# Patient Record
Sex: Male | Born: 2000 | Race: Black or African American | Hispanic: No | Marital: Single | Smoking: Never smoker
Health system: Southern US, Community
[De-identification: ages and names within clinical notes are randomized; demographics above are authoritative.]

## PROBLEM LIST (undated history)

## (undated) DIAGNOSIS — J45909 Unspecified asthma, uncomplicated: Secondary | ICD-10-CM

---
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
## 2023-03-23 ENCOUNTER — Emergency Department
Admission: EM | Admit: 2023-03-23 | Discharge: 2023-03-23 | Disposition: A | Discharge status: Home / Self Care | Payer: BC Managed Care – PPO | Attending: Emergency Medicine | Admitting: Emergency Medicine

## 2023-03-23 DIAGNOSIS — K2970 Gastritis, unspecified, without bleeding: Secondary | ICD-10-CM | POA: Diagnosis not present

## 2023-03-23 DIAGNOSIS — R1010 Upper abdominal pain, unspecified: Secondary | ICD-10-CM | POA: Diagnosis present

## 2023-03-23 DIAGNOSIS — R0789 Other chest pain: Secondary | ICD-10-CM | POA: Diagnosis not present

## 2023-03-23 LAB — CBC
HCT: 45 % (ref 39.0–52.0)
Hemoglobin: 15.9 g/dL (ref 13.0–17.0)
MCH: 33.1 pg (ref 26.0–34.0)
MCHC: 35.3 g/dL (ref 30.0–36.0)
MCV: 93.6 fL (ref 80.0–100.0)
Platelets: 286 10*3/uL (ref 150–400)
RBC: 4.81 MIL/uL (ref 4.22–5.81)
RDW: 12.1 % (ref 11.5–15.5)
WBC: 8.3 10*3/uL (ref 4.0–10.5)
nRBC: 0 % (ref 0.0–0.2)

## 2023-03-23 LAB — TROPONIN I (HIGH SENSITIVITY)
Troponin I (High Sensitivity): <2 ng/L (ref <18)
Troponin I (High Sensitivity): 3 ng/L (ref <18)

## 2023-03-23 LAB — COMPREHENSIVE METABOLIC PANEL
ALT: 18 U/L (ref 0–44)
AST: 22 U/L (ref 15–41)
Albumin: 5.3 g/dL — ABNORMAL HIGH (ref 3.5–5.0)
Alkaline Phosphatase: 46 U/L (ref 38–126)
Anion gap: 14 (ref 5–15)
BUN: 16 mg/dL (ref 6–20)
CO2: 21 mmol/L — ABNORMAL LOW (ref 22–32)
Calcium: 9.8 mg/dL (ref 8.9–10.3)
Chloride: 102 mmol/L (ref 98–111)
Creatinine, Ser: 0.83 mg/dL (ref 0.61–1.24)
GFR, Estimated: >60 mL/min (ref >60)
Glucose, Bld: 90 mg/dL (ref 70–99)
Potassium: 3.5 mmol/L (ref 3.5–5.1)
Sodium: 137 mmol/L (ref 135–145)
Total Bilirubin: 1.5 mg/dL — ABNORMAL HIGH (ref <1.2)
Total Protein: 8.5 g/dL — ABNORMAL HIGH (ref 6.5–8.1)

## 2023-03-23 LAB — LIPASE, BLOOD: Lipase: 29 U/L (ref 11–51)

## 2023-03-23 MED ORDER — PANTOPRAZOLE SODIUM 40 MG PO TBEC: 40.0000 mg | DELAYED_RELEASE_TABLET | Freq: Every day | ORAL | 1 refills | Status: AC

## 2023-03-23 MED ORDER — ALUM & MAG HYDROXIDE-SIMETH 200-200-20 MG/5ML PO SUSP
30.0000 mL | Freq: Once | ORAL | Status: AC
  Administered 2023-03-23: 30 mL via ORAL
  Filled 2023-03-23: qty 30

## 2023-03-23 MED ORDER — LIDOCAINE VISCOUS HCL 2 % MT SOLN
15.0000 mL | Freq: Once | OROMUCOSAL | Status: AC
  Administered 2023-03-23: 15 mL via ORAL
  Filled 2023-03-23: qty 15

## 2023-03-23 NOTE — ED Notes (Signed)
Pt unable to void at this time. 

## 2023-03-23 NOTE — ED Provider Notes (Signed)
[facility] Provider Note    Event Date/Time   First MD Initiated Contact with Patient 03/23/23 2015     (approximate)   History   Abdominal Pain   HPI  PASON BRADNEY is a 22 y.o. male who presents to the emergency department today because of concerns for upper abdominal and lower chest pain.  The patient's symptoms started a couple of days ago.  He states that the pain does move around these areas.  It will at times be a sharp type of pain.  He denies any pain with deep breaths.  Eating does seem to make the pain slightly worse.  Did try Tums which helped.  He states he does eat a lot of spicy foods.     Physical Exam   Triage Vital Signs: ED Triage Vitals  Encounter Vitals Group     BP 03/23/23 1739 (!) 137/101     Systolic BP Percentile --      Diastolic BP Percentile --      Pulse Rate 03/23/23 1739 (!) 105     Resp 03/23/23 1739 16     Temp 03/23/23 1739 99.1 F (37.3 C)     Temp Source 03/23/23 1739 Oral     SpO2 03/23/23 1739 100 %     Weight 03/23/23 1740 130 lb (59 kg)     Height 03/23/23 1740 5\' 10"  (1.778 m)     Head Circumference --      Peak Flow --      Pain Score 03/23/23 1739 0     Pain Loc --      Pain Education --      Exclude from Growth Chart --     Most recent vital signs: Vitals:   03/23/23 1739  BP: (!) 137/101  Pulse: (!) 105  Resp: 16  Temp: 99.1 F (37.3 C)  SpO2: 100%    General: Awake, alert, oriented. CV:  Good peripheral perfusion. Regular rate and rhythm. Resp:  Normal effort. Lungs clear. Abd:  No distention. Non tender.   ED Results / Procedures / Treatments   Labs (all labs ordered are listed, but only abnormal results are displayed) Labs Reviewed  COMPREHENSIVE METABOLIC PANEL - Abnormal; Notable for the following components:      Result Value   CO2 21 (*)    Total Protein 8.5 (*)    Albumin 5.3 (*)    Total Bilirubin 1.5 (*)    All other components within normal limits  LIPASE, BLOOD   CBC  URINALYSIS, ROUTINE W REFLEX MICROSCOPIC  TROPONIN I (HIGH SENSITIVITY)  TROPONIN I (HIGH SENSITIVITY)     EKG  I, Phineas Semen, attending physician, personally viewed and interpreted this EKG  EKG Time: 1747 Rate: 78 Rhythm: sinus rhythm Axis: normal Intervals: qtc 405 QRS: narrow, q waves v1, v2 ST changes: no st elevation Impression: abnormal EKG, when compared to EKG dated 07/10/09 q waves present in v1, v2    RADIOLOGY None  PROCEDURES:  Critical Care performed: No    MEDICATIONS ORDERED IN ED: Medications  alum & mag hydroxide-simeth (MAALOX/MYLANTA) 200-200-20 MG/5ML suspension 30 mL (30 mLs Oral Given 03/23/23 2121)    And  lidocaine (XYLOCAINE) 2 % viscous mouth solution 15 mL (15 mLs Oral Given 03/23/23 2121)     IMPRESSION / MDM / ASSESSMENT AND PLAN / ED COURSE  I reviewed the triage vital signs and the nursing notes.  Differential diagnosis includes, but is not limited to, ACS, GERD, pneumonia  Patient's presentation is most consistent with acute presentation with potential threat to life or bodily function.   Patient presented to the emergency department today because of concerns for upper abdominal and lower chest pain.  On exam abdomen is benign.  Lungs are clear.  EKG does show some abnormality although it appears this was present on EKG dated 13 years ago.  Troponin was negative here.  At this time I have low concern for ACS.  Additionally patient was given GI cocktail here which did help improve his pain.  This time I do think GERD likely.  Discussed this with the patient.  Will plan on discharging with antacid and dietary guidelines.     FINAL CLINICAL IMPRESSION(S) / ED DIAGNOSES   Final diagnoses:  Gastritis without bleeding, unspecified chronicity, unspecified gastritis type     Note:  This document was prepared using Dragon voice recognition software and may include unintentional dictation  errors.    Phineas Semen, MD 03/23/23 [phone]

## 2023-03-23 NOTE — ED Triage Notes (Signed)
Pt c/o epigastric/chest pain since Sunday. Pt denies SOB, N/V, diaphoresis. States that eating makes the pain worse. Pt reports relief with TUMS.

## 2023-03-25 ENCOUNTER — Emergency Department
Admission: EM | Admit: 2023-03-25 | Discharge: 2023-03-25 | Disposition: A | Discharge status: Home / Self Care | Payer: BC Managed Care – PPO | Attending: Emergency Medicine | Admitting: Emergency Medicine

## 2023-03-25 ENCOUNTER — Other Ambulatory Visit: Payer: Self-pay

## 2023-03-25 DIAGNOSIS — R0602 Shortness of breath: Secondary | ICD-10-CM | POA: Diagnosis not present

## 2023-03-25 DIAGNOSIS — J45901 Unspecified asthma with (acute) exacerbation: Secondary | ICD-10-CM | POA: Insufficient documentation

## 2023-03-25 DIAGNOSIS — R457 State of emotional shock and stress, unspecified: Secondary | ICD-10-CM | POA: Diagnosis not present

## 2023-03-25 DIAGNOSIS — R000 Tachycardia, unspecified: Secondary | ICD-10-CM | POA: Diagnosis not present

## 2023-03-25 LAB — BASIC METABOLIC PANEL
Anion gap: 16 — ABNORMAL HIGH (ref 5–15)
BUN: 22 mg/dL — ABNORMAL HIGH (ref 6–20)
CO2: 18 mmol/L — ABNORMAL LOW (ref 22–32)
Calcium: 9.8 mg/dL (ref 8.9–10.3)
Chloride: 103 mmol/L (ref 98–111)
Creatinine, Ser: 0.95 mg/dL (ref 0.61–1.24)
GFR, Estimated: >60 mL/min (ref >60)
Glucose, Bld: 92 mg/dL (ref 70–99)
Potassium: 3.2 mmol/L — ABNORMAL LOW (ref 3.5–5.1)
Sodium: 137 mmol/L (ref 135–145)

## 2023-03-25 LAB — CBC
HCT: 44.6 % (ref 39.0–52.0)
Hemoglobin: 16.2 g/dL (ref 13.0–17.0)
MCH: 33.1 pg (ref 26.0–34.0)
MCHC: 36.3 g/dL — ABNORMAL HIGH (ref 30.0–36.0)
MCV: 91 fL (ref 80.0–100.0)
Platelets: 289 10*3/uL (ref 150–400)
RBC: 4.9 MIL/uL (ref 4.22–5.81)
RDW: 11.9 % (ref 11.5–15.5)
WBC: 6.7 10*3/uL (ref 4.0–10.5)
nRBC: 0 % (ref 0.0–0.2)

## 2023-03-25 MED ORDER — IPRATROPIUM-ALBUTEROL 0.5-2.5 (3) MG/3ML IN SOLN
3.0000 mL | Freq: Once | RESPIRATORY_TRACT | Status: AC
  Administered 2023-03-25: 3 mL via RESPIRATORY_TRACT
  Filled 2023-03-25: qty 3

## 2023-03-25 MED ORDER — POTASSIUM CHLORIDE CRYS ER 20 MEQ PO TBCR
40.0000 meq | EXTENDED_RELEASE_TABLET | Freq: Once | ORAL | Status: AC
  Administered 2023-03-25: 40 meq via ORAL
  Filled 2023-03-25: qty 2

## 2023-03-25 MED ORDER — PREDNISONE 50 MG PO TABS: 50.0000 mg | ORAL_TABLET | Freq: Every day | ORAL | 0 refills | Status: AC

## 2023-03-25 NOTE — ED Triage Notes (Signed)
Arrives from work via [employer]  C/O chest pain, anxiety. Had similar episode 2 days ago.  Has history of asthma, used inhaler earlier today.    VS wnl

## 2023-03-25 NOTE — ED Notes (Signed)
 Pt d/c home per EDP order. Discharge summary reviewed, pt verbalizes understanding. Ambulatory off unit. NAD.

## 2023-03-25 NOTE — ED Triage Notes (Signed)
Pt to ED via [facility] from work. Pt reports increased anxiety this morning while at work. Pt reports numbness and tinging in both arms. Pt was seen recently for same but was unable to pick up prescription. Pt reports tums with relief.

## 2023-03-25 NOTE — ED Provider Notes (Signed)
[facility] Provider Note    Event Date/Time   First MD Initiated Contact with Patient 03/25/23 1248     (approximate)   History   Shortness of breath   HPI  Brian Schneider is a 22 y.o. male with a history of asthma who presents with complaints of shortness of breath.  Patient reports he was at work started to feel winded and tightness in his chest, he became anxious after this.  Using his inhaler which seemed to help somewhat.  Describes bilateral hand tingling which lasted about 2 minutes now better.  No weakness.     Physical Exam   Triage Vital Signs: ED Triage Vitals  Encounter Vitals Group     BP 03/25/23 1146 (!) 136/103     Systolic BP Percentile --      Diastolic BP Percentile --      Pulse Rate 03/25/23 1146 (!) 136     Resp 03/25/23 1146 20     Temp 03/25/23 1146 98.2 F (36.8 C)     Temp Source 03/25/23 1146 Oral     SpO2 03/25/23 1146 100 %     Weight --      Height --      Head Circumference --      Peak Flow --      Pain Score 03/25/23 1143 7     Pain Loc --      Pain Education --      Exclude from Growth Chart --     Most recent vital signs: Vitals:   03/25/23 1146 03/25/23 1349  BP: (!) 136/103 122/83  Pulse: (!) 136 90  Resp: 20 18  Temp: 98.2 F (36.8 C) 98.2 F (36.8 C)  SpO2: 100% 100%     General: Awake, no distress.  CV:  Good peripheral perfusion.  Resp:  Normal effort.  Scattered wheezing on exam Abd:  No distention.  Other:  Normal strength in the upper extremities, normal neuroexam   ED Results / Procedures / Treatments   Labs (all labs ordered are listed, but only abnormal results are displayed) Labs Reviewed  CBC - Abnormal; Notable for the following components:      Result Value   MCHC 36.3 (*)    All other components within normal limits  BASIC METABOLIC PANEL - Abnormal; Notable for the following components:   Potassium 3.2 (*)    CO2 18 (*)    BUN 22 (*)    Anion gap 16 (*)    All  other components within normal limits     EKG  ED ECG REPORT I, Jene Every, the attending physician, personally viewed and interpreted this ECG.  Date: 03/25/2023  Rhythm: Sinus tachycardia QRS Axis: normal Intervals: normal ST/T Wave abnormalities: normal Narrative Interpretation: no evidence of acute ischemia    RADIOLOGY     PROCEDURES:  Critical Care performed:   Procedures   MEDICATIONS ORDERED IN ED: Medications  potassium chloride SA (KLOR-CON M) CR tablet 40 mEq (40 mEq Oral Given 03/25/23 1322)  ipratropium-albuterol (DUONEB) 0.5-2.5 (3) MG/3ML nebulizer solution 3 mL (3 mLs Nebulization Given 03/25/23 1323)     IMPRESSION / MDM / ASSESSMENT AND PLAN / ED COURSE  I reviewed the triage vital signs and the nursing notes. Patient's presentation is most consistent with acute illness / injury with system symptoms.  Patient presents with shortness of breath as detailed above, overall well-appearing and in no acute distress, scattered mild wheezes, suspect  mild asthma exacerbation with possibly a component of hyperventilation causing tingling in the hands.  Treated with DuoNebs with significant improvement, patient reports symptoms have resolved.  Lab work demonstrates mild hypokalemia, dose of K-Dur given, no indication for admission at this time given patient feeling much better, appropriate for discharge with outpatient follow-up.  Return precautions discussed.        FINAL CLINICAL IMPRESSION(S) / ED DIAGNOSES   Final diagnoses:  Moderate asthma with acute exacerbation, unspecified whether persistent     Rx / DC Orders   ED Discharge Orders          Ordered    predniSONE (DELTASONE) 50 MG tablet  Daily with breakfast        03/25/23 1343             Note:  This document was prepared using Dragon voice recognition software and may include unintentional dictation errors.   Jene Every, MD 03/25/23 [phone]

## 2023-03-28 DIAGNOSIS — F411 Generalized anxiety disorder: Secondary | ICD-10-CM | POA: Diagnosis not present

## 2023-03-28 DIAGNOSIS — Z8709 Personal history of other diseases of the respiratory system: Secondary | ICD-10-CM | POA: Diagnosis not present

## 2023-03-28 DIAGNOSIS — R0789 Other chest pain: Secondary | ICD-10-CM | POA: Diagnosis not present

## 2023-03-30 ENCOUNTER — Emergency Department: Payer: BC Managed Care – PPO

## 2023-03-30 ENCOUNTER — Emergency Department
Admission: EM | Admit: 2023-03-30 | Discharge: 2023-03-30 | Disposition: A | Discharge status: Home / Self Care | Payer: BC Managed Care – PPO | Attending: Emergency Medicine | Admitting: Emergency Medicine

## 2023-03-30 ENCOUNTER — Other Ambulatory Visit: Payer: Self-pay

## 2023-03-30 DIAGNOSIS — Z0389 Encounter for observation for other suspected diseases and conditions ruled out: Secondary | ICD-10-CM | POA: Diagnosis not present

## 2023-03-30 DIAGNOSIS — R0602 Shortness of breath: Secondary | ICD-10-CM | POA: Insufficient documentation

## 2023-03-30 DIAGNOSIS — R069 Unspecified abnormalities of breathing: Secondary | ICD-10-CM | POA: Diagnosis not present

## 2023-03-30 DIAGNOSIS — J45909 Unspecified asthma, uncomplicated: Secondary | ICD-10-CM | POA: Insufficient documentation

## 2023-03-30 DIAGNOSIS — R0689 Other abnormalities of breathing: Secondary | ICD-10-CM | POA: Diagnosis not present

## 2023-03-30 LAB — CBC
HCT: 45 % (ref 39.0–52.0)
Hemoglobin: 16.2 g/dL (ref 13.0–17.0)
MCH: 33.2 pg (ref 26.0–34.0)
MCHC: 36 g/dL (ref 30.0–36.0)
MCV: 92.2 fL (ref 80.0–100.0)
Platelets: 297 10*3/uL (ref 150–400)
RBC: 4.88 MIL/uL (ref 4.22–5.81)
RDW: 11.9 % (ref 11.5–15.5)
WBC: 6.3 10*3/uL (ref 4.0–10.5)
nRBC: 0 % (ref 0.0–0.2)

## 2023-03-30 LAB — BASIC METABOLIC PANEL
Anion gap: 10 (ref 5–15)
BUN: 16 mg/dL (ref 6–20)
CO2: 23 mmol/L (ref 22–32)
Calcium: 9.6 mg/dL (ref 8.9–10.3)
Chloride: 106 mmol/L (ref 98–111)
Creatinine, Ser: 1.04 mg/dL (ref 0.61–1.24)
GFR, Estimated: >60 mL/min (ref >60)
Glucose, Bld: 99 mg/dL (ref 70–99)
Potassium: 3.2 mmol/L — ABNORMAL LOW (ref 3.5–5.1)
Sodium: 139 mmol/L (ref 135–145)

## 2023-03-30 LAB — TROPONIN I (HIGH SENSITIVITY): Troponin I (High Sensitivity): <2 ng/L (ref <18)

## 2023-03-30 MED ORDER — HYDROXYZINE HCL 10 MG PO TABS
10.0000 mg | ORAL_TABLET | Freq: Three times a day (TID) | ORAL | 0 refills | Status: AC | PRN
Start: 2023-03-30 — End: ?

## 2023-03-30 MED ORDER — FLUTICASONE-SALMETEROL 100-50 MCG/ACT IN AEPB: 1.0000 | INHALATION_SPRAY | Freq: Two times a day (BID) | RESPIRATORY_TRACT | 0 refills | Status: DC

## 2023-03-30 MED ORDER — IOHEXOL 350 MG/ML SOLN
75.0000 mL | Freq: Once | INTRAVENOUS | Status: AC | PRN
Start: 2023-03-30 — End: 2023-03-30
  Administered 2023-03-30: 75 mL via INTRAVENOUS

## 2023-03-30 MED ORDER — FLUTICASONE-SALMETEROL 100-50 MCG/ACT IN AEPB: 1.0000 | INHALATION_SPRAY | Freq: Two times a day (BID) | RESPIRATORY_TRACT | 0 refills | Status: AC

## 2023-03-30 MED ORDER — HYDROXYZINE HCL 10 MG PO TABS: 10.0000 mg | ORAL_TABLET | Freq: Three times a day (TID) | ORAL | 0 refills | Status: DC | PRN

## 2023-03-30 NOTE — ED Provider Notes (Signed)
[facility] Provider Note    Event Date/Time   First MD Initiated Contact with Patient 03/30/23 1243     (approximate)   History   Shortness of Breath   HPI  Brian Schneider is a 22 y.o. male  with PMH of asthma who presents for evaluation of ongoing chest pain and shortness of breath since 11/13. Patient has been seen in the ED 3 times and by his PCP once. He has been given treatment for asthma and GERD.  Patient states that the shortness of breath comes and goes throughout the day.  He feels like the prednisone he was given for his asthma helped but he took his last dose yesterday.      Physical Exam   Triage Vital Signs: ED Triage Vitals  Encounter Vitals Group     BP 03/30/23 1236 116/81     Systolic BP Percentile --      Diastolic BP Percentile --      Pulse Rate 03/30/23 1236 (!) 107     Resp 03/30/23 1234 16     Temp 03/30/23 1234 97.6 F (36.4 C)     Temp src --      SpO2 03/30/23 1234 100 %     Weight 03/30/23 1236 120 lb (54.4 kg)     Height 03/30/23 1236 5\' 7"  (1.702 m)     Head Circumference --      Peak Flow --      Pain Score 03/30/23 1236 3     Pain Loc --      Pain Education --      Exclude from Growth Chart --     Most recent vital signs: Vitals:   03/30/23 1234 03/30/23 1236  BP:  116/81  Pulse:  (!) 107  Resp: 16   Temp: 97.6 F (36.4 C)   SpO2: 100% 100%    General: Awake, no distress.  CV:  Good peripheral perfusion.  Tachycardic but regular rhythm. Resp:  Normal effort.  CTAB. Abd:  No distention.  Other:     ED Results / Procedures / Treatments   Labs (all labs ordered are listed, but only abnormal results are displayed) Labs Reviewed  BASIC METABOLIC PANEL - Abnormal; Notable for the following components:      Result Value   Potassium 3.2 (*)    All other components within normal limits  CBC  TROPONIN I (HIGH SENSITIVITY)     EKG  ED provider interpretation: Normal sinus rhythm with  rightward axis.  Vent. rate 99 BPM PR interval 122 ms QRS duration 92 ms QT/QTcB 332/426 ms P-R-T axes 82 94 11   RADIOLOGY  Chest x-ray obtained, interpreted the images as well as reviewed the radiologist report which is negative for any acute cardiopulmonary abnormalities.  CT angio of the chest was negative for embolism, lung mass, consolidation and pleural effusion.  PROCEDURES:  Critical Care performed: No  Procedures   MEDICATIONS ORDERED IN ED: Medications  iohexol (OMNIPAQUE) 350 MG/ML injection 75 mL (75 mLs Intravenous Contrast Given 03/30/23 1455)     IMPRESSION / MDM / ASSESSMENT AND PLAN / ED COURSE  I reviewed the triage vital signs and the nursing notes.                             22 year old male presents for evaluation of ongoing shortness of breath and chest pain.  Patient was tachycardic in  triage otherwise vital signs are stable.  Patient NAD on exam.  Differential diagnosis includes, but is not limited to, ACS, aortic dissection, pulmonary embolism, cardiac tamponade, pneumothorax, pneumonia, pericarditis, myocarditis, GI-related causes including esophagitis/gastritis, and musculoskeletal chest wall pain.    Patient's presentation is most consistent with acute complicated illness / injury requiring diagnostic workup.  I suspect that patient's symptoms are most likely related to his anxiety, which he is in agreement with, however since this is his fifth presentation to medical provider in the last week, I felt that further workup was warranted.  CBC and BMP unremarkable.  Troponin not elevated.  Chest x-ray and CT angio of the chest obtained and was negative for any acute abnormalities.  Patient was given reassurance.  I believe his chest pain and shortness of breath is coming from a combination of things including GERD, asthma and anxiety.  I encouraged him to continue to take the Protonix.  He has already been sent an albuterol inhaler for his asthma.   I will send him a combination inhaler as it sounds like he is needing to use the albuterol inhaler multiple times a day, so I do not think his asthma is well-controlled.  Patient also requested a medication for anxiety.  I explained that I cannot start something long-term like an SSRI but I will give him something to use as needed.  I advised him to follow-up with his PCP to discuss management of his asthma as well as starting an anxiety medication.  Patient voiced understanding, all questions were answered and he is stable at discharge.     FINAL CLINICAL IMPRESSION(S) / ED DIAGNOSES   Final diagnoses:  SOB (shortness of breath)     Rx / DC Orders   ED Discharge Orders          Ordered    fluticasone-salmeterol (ADVAIR DISKUS) 100-50 MCG/ACT AEPB  2 times daily        03/30/23 1653    hydrOXYzine (ATARAX) 10 MG tablet  3 times daily PRN        03/30/23 1653             Note:  This document was prepared using Dragon voice recognition software and may include unintentional dictation errors.   Cameron Ali, PA-C 03/30/23 1658    Phineas Semen, MD 04/01/23 [phone]

## 2023-03-30 NOTE — ED Triage Notes (Signed)
See first nurse note. 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 Reports being seen recently for same. States relief with prednisone but finished yesterday. Reports pain to left lower chest. RR Even and unlabored, speaking in complete sentences.

## 2023-03-30 NOTE — Discharge Instructions (Addendum)
Your blood work, chest x-ray and CT scan was normal today.  I believe your chest pain and shortness of breath is caused by combination of acid reflux, asthma and anxiety.  Please pick up your prescription for Protonix, this is a medication for acid reflux.  I have sent another inhaler to your pharmacy, this is to be used daily as more of a long-term treatment.  I have also sent hydroxyzine which can be taken 3 times daily as needed for anxiety.  The hydroxyzine can be sedating, so do not drive after taking it.  I encourage you to follow-up with your primary care provider to discuss management of your asthma as well as possibly starting a medication for your anxiety.  It was a pleasure to care for you today!

## 2023-03-30 NOTE — ED Notes (Signed)
First Nurse Note: [place] [facility] [place] for shortness of breath. Pt seen 3 times in the last week for the same. Pt finished his prednisone but has not gotten his inhaler. Pt reports shob started this morning and got progressively worse.

## 2023-03-30 NOTE — ED Notes (Signed)
Patient transported to X-ray 

## 2023-04-04 DIAGNOSIS — F411 Generalized anxiety disorder: Secondary | ICD-10-CM | POA: Diagnosis not present

## 2023-04-04 DIAGNOSIS — J4540 Moderate persistent asthma, uncomplicated: Secondary | ICD-10-CM | POA: Diagnosis not present

## 2023-05-18 DIAGNOSIS — J4540 Moderate persistent asthma, uncomplicated: Secondary | ICD-10-CM | POA: Diagnosis not present

## 2023-05-18 DIAGNOSIS — F411 Generalized anxiety disorder: Secondary | ICD-10-CM | POA: Diagnosis not present

## 2023-06-06 ENCOUNTER — Ambulatory Visit: Payer: BC Managed Care – PPO | Admitting: Internal Medicine
